# Patient Record
Sex: Female | Born: 1975 | Race: White | Hispanic: No | Marital: Single | Smoking: Never smoker
Health system: Southern US, Community
[De-identification: ages and names within clinical notes are randomized; demographics above are authoritative.]

## PROBLEM LIST (undated history)

## (undated) DIAGNOSIS — I10 Essential (primary) hypertension: Secondary | ICD-10-CM

## (undated) DIAGNOSIS — E119 Type 2 diabetes mellitus without complications: Secondary | ICD-10-CM

## (undated) DIAGNOSIS — G459 Transient cerebral ischemic attack, unspecified: Secondary | ICD-10-CM

## (undated) DIAGNOSIS — I639 Cerebral infarction, unspecified: Secondary | ICD-10-CM

## (undated) DIAGNOSIS — U071 COVID-19: Secondary | ICD-10-CM

## (undated) HISTORY — PX: ABDOMINAL HYSTERECTOMY: SHX81

---

## 2002-11-27 ENCOUNTER — Emergency Department (HOSPITAL_COMMUNITY): Admission: EM | Admit: 2002-11-27 | Discharge: 2002-11-27 | Payer: Self-pay | Admitting: Emergency Medicine

## 2003-03-20 ENCOUNTER — Encounter: Payer: Self-pay | Admitting: *Deleted

## 2003-03-20 ENCOUNTER — Ambulatory Visit (HOSPITAL_COMMUNITY): Admission: RE | Admit: 2003-03-20 | Discharge: 2003-03-20 | Payer: Self-pay | Admitting: *Deleted

## 2003-08-23 ENCOUNTER — Emergency Department (HOSPITAL_COMMUNITY): Admission: EM | Admit: 2003-08-23 | Discharge: 2003-08-23 | Payer: Self-pay | Admitting: Emergency Medicine

## 2018-06-20 ENCOUNTER — Other Ambulatory Visit: Payer: Self-pay

## 2018-06-20 ENCOUNTER — Emergency Department (HOSPITAL_COMMUNITY): Payer: Self-pay

## 2018-06-20 ENCOUNTER — Emergency Department (HOSPITAL_COMMUNITY)
Admission: EM | Admit: 2018-06-20 | Discharge: 2018-06-20 | Disposition: A | Discharge status: Home / Self Care | Payer: Self-pay | Attending: Emergency Medicine | Admitting: Emergency Medicine

## 2018-06-20 ENCOUNTER — Encounter (HOSPITAL_COMMUNITY): Payer: Self-pay

## 2018-06-20 DIAGNOSIS — E119 Type 2 diabetes mellitus without complications: Secondary | ICD-10-CM | POA: Insufficient documentation

## 2018-06-20 DIAGNOSIS — K047 Periapical abscess without sinus: Secondary | ICD-10-CM | POA: Insufficient documentation

## 2018-06-20 DIAGNOSIS — L03211 Cellulitis of face: Secondary | ICD-10-CM | POA: Insufficient documentation

## 2018-06-20 DIAGNOSIS — I10 Essential (primary) hypertension: Secondary | ICD-10-CM | POA: Insufficient documentation

## 2018-06-20 HISTORY — DX: Type 2 diabetes mellitus without complications: E11.9

## 2018-06-20 HISTORY — DX: Cerebral infarction, unspecified: I63.9

## 2018-06-20 HISTORY — DX: Transient cerebral ischemic attack, unspecified: G45.9

## 2018-06-20 HISTORY — DX: Essential (primary) hypertension: I10

## 2018-06-20 LAB — CBC WITH DIFFERENTIAL/PLATELET
BASOS ABS: 0.1 10*3/uL (ref 0.0–0.1)
BASOS PCT: 1 %
EOS ABS: 0.2 10*3/uL (ref 0.0–0.7)
EOS PCT: 2 %
HCT: 41.1 % (ref 36.0–46.0)
Hemoglobin: 14.5 g/dL (ref 12.0–15.0)
Lymphocytes Relative: 29 %
Lymphs Abs: 3 10*3/uL (ref 0.7–4.0)
MCH: 31.3 pg (ref 26.0–34.0)
MCHC: 35.3 g/dL (ref 30.0–36.0)
MCV: 88.8 fL (ref 78.0–100.0)
MONO ABS: 0.7 10*3/uL (ref 0.1–1.0)
MONOS PCT: 7 %
NEUTROS ABS: 6.4 10*3/uL (ref 1.7–7.7)
Neutrophils Relative %: 61 %
PLATELETS: 292 10*3/uL (ref 150–400)
RBC: 4.63 MIL/uL (ref 3.87–5.11)
RDW: 12.6 % (ref 11.5–15.5)
WBC: 10.3 10*3/uL (ref 4.0–10.5)

## 2018-06-20 LAB — BASIC METABOLIC PANEL
ANION GAP: 8 (ref 5–15)
BUN: 11 mg/dL (ref 6–20)
CALCIUM: 8.9 mg/dL (ref 8.9–10.3)
CO2: 26 mmol/L (ref 22–32)
CREATININE: 0.79 mg/dL (ref 0.44–1.00)
Chloride: 103 mmol/L (ref 98–111)
GFR calc Af Amer: >60 mL/min (ref >60)
GLUCOSE: 195 mg/dL — AB (ref 70–99)
Potassium: 3.3 mmol/L — ABNORMAL LOW (ref 3.5–5.1)
Sodium: 137 mmol/L (ref 135–145)

## 2018-06-20 MED ORDER — MORPHINE SULFATE (PF) 4 MG/ML IV SOLN
4.0000 mg | Freq: Once | INTRAVENOUS | Status: AC
  Administered 2018-06-20: 4 mg via INTRAVENOUS
  Filled 2018-06-20: qty 1

## 2018-06-20 MED ORDER — HYDROCODONE-ACETAMINOPHEN 5-325 MG PO TABS: 1.0000 | ORAL_TABLET | ORAL | 0 refills | Status: AC | PRN

## 2018-06-20 MED ORDER — IOHEXOL 300 MG/ML  SOLN
75.0000 mL | Freq: Once | INTRAMUSCULAR | Status: AC | PRN
  Administered 2018-06-20: 75 mL via INTRAVENOUS

## 2018-06-20 MED ORDER — CLINDAMYCIN HCL 150 MG PO CAPS: 300.0000 mg | ORAL_CAPSULE | Freq: Four times a day (QID) | ORAL | 0 refills | Status: AC

## 2018-06-20 MED ORDER — CLINDAMYCIN PHOSPHATE 600 MG/50ML IV SOLN
600.0000 mg | Freq: Once | INTRAVENOUS | Status: AC
  Administered 2018-06-20: 600 mg via INTRAVENOUS
  Filled 2018-06-20: qty 50

## 2018-06-20 MED ORDER — ONDANSETRON HCL 4 MG/2ML IJ SOLN
4.0000 mg | Freq: Once | INTRAMUSCULAR | Status: AC
Start: 2018-06-20 — End: 2018-06-20
  Administered 2018-06-20: 4 mg via INTRAVENOUS
  Filled 2018-06-20: qty 2

## 2018-06-20 NOTE — ED Provider Notes (Signed)
[facility] EMERGENCY DEPARTMENT Provider Note   CSN: [id number] Arrival date & time: 06/20/18  1645     History   Chief Complaint Chief Complaint  Patient presents with  . Abscess    HPI Madeline Butler is a 42 y.o. female presenting with dental pain and abscess.  She reports chronic poor dentition and is in the process of arranging for extractions of 6 teeth, her dentist is in [employer].  Over the past 24 hours she has developed increased pain, swelling which is tracking across her right cheek and when she woke this morning she had swelling and pain which ends just beneath her right eye.  She has had several rounds of amoxicillin, her last dose was taken last week which has temporized her dental pain until now.  She denies fevers or chills.  She does have mild nausea without emesis.  Past medical history is significant for newly diagnosed diabetes and hypertension.  She knows her blood pressures have been elevated which she states is driven by pain.  She has had no pain relievers prior to arrival.  She denies headache, chest pain, shortness of breath.  The history is provided by the patient.    Past Medical History:  Diagnosis Date  . Diabetes mellitus without complication (HCC)   . Hypertension   . Mini stroke (HCC)     There are no active problems to display for this patient.   Past Surgical History:  Procedure Laterality Date  . ABDOMINAL HYSTERECTOMY       OB History   None      Home Medications    Prior to Admission medications   Medication Sig Start Date End Date Taking? Authorizing Provider  clindamycin (CLEOCIN) 150 MG capsule Take 2 capsules (300 mg total) by mouth 4 (four) times daily. 06/20/18   Burgess Amor, PA-C  HYDROcodone-acetaminophen (NORCO/VICODIN) 5-325 MG tablet Take 1 tablet by mouth every 4 (four) hours as needed. 06/20/18   Burgess Amor, PA-C    Family History No family history on file.  Social History Social History   Tobacco Use  .  Smoking status: Never Smoker  . Smokeless tobacco: Never Used  Substance Use Topics  . Alcohol use: Not on file    Comment: occasional   . Drug use: Never     Allergies   Patient has no known allergies.   Review of Systems Review of Systems  Constitutional: Negative for chills and fever.  HENT: Positive for dental problem and facial swelling. Negative for sore throat.   Eyes: Negative for discharge, redness and visual disturbance.  Respiratory: Negative for shortness of breath.   Cardiovascular: Negative.   Gastrointestinal: Negative.  Negative for nausea and vomiting.  Musculoskeletal: Negative for neck pain and neck stiffness.  Skin: Positive for color change.  Neurological: Positive for headaches.     Physical Exam Updated Vital Signs BP (!) 166/95 (BP Location: Left Arm)   Pulse 70   Temp 98 F (36.7 C) (Oral)   Resp 14   Ht 5\' 8"  (1.727 m)   Wt (!) 154.2 kg (340 lb)   SpO2 97%   BMI 51.70 kg/m   Physical Exam  Constitutional: She is oriented to person, place, and time. She appears well-developed and well-nourished. No distress.  HENT:  Head: Normocephalic and atraumatic.  Right Ear: Tympanic membrane and external ear normal. No middle ear effusion.  Left Ear: Tympanic membrane and external ear normal.  No middle ear effusion.  Nose: Nose  normal. No mucosal edema or rhinorrhea.  Mouth/Throat: Oropharynx is clear and moist and mucous membranes are normal. No oral lesions. No trismus in the jaw. Dental abscesses present.  Gingival edema around the 1st and second molars right upper, no fluctuance or drainage.  No trismus.  No sublingual edema.  ttp with mild erythema and induration of right cheek and along right nasal fold to the right infraorbital space. No fluctuance.   Eyes: Conjunctivae are normal.  Neck: Normal range of motion. Neck supple.  Cardiovascular: Normal rate and normal heart sounds.  Pulmonary/Chest: Effort normal.  Abdominal: She exhibits no  distension.  Musculoskeletal: Normal range of motion.  Lymphadenopathy:    She has no cervical adenopathy.  Neurological: She is alert and oriented to person, place, and time.  Skin: Skin is warm and dry. No erythema.  Psychiatric: She has a normal mood and affect.     ED Treatments / Results  Labs (all labs ordered are listed, but only abnormal results are displayed) Labs Reviewed  BASIC METABOLIC PANEL - Abnormal; Notable for the following components:      Result Value   Potassium 3.3 (*)    Glucose, Bld 195 (*)    All other components within normal limits  CBC WITH DIFFERENTIAL/PLATELET    EKG None  Radiology Ct Maxillofacial W Contrast  Result Date: 06/20/2018 CLINICAL DATA:  Right facial cellulitis EXAM: CT MAXILLOFACIAL WITH CONTRAST TECHNIQUE: Multidetector CT imaging of the maxillofacial structures was performed with intravenous contrast. Multiplanar CT image reconstructions were also generated. CONTRAST:  75mL OMNIPAQUE IOHEXOL 300 MG/ML  SOLN COMPARISON:  None. FINDINGS: Osseous: No facial fracture. Dental: Periapical lucencies of teeth 9 and 10. Multiple right mandibular dental caries, at teeth 29-31. Orbits: The globes are intact. Normal appearance of the intra- and extraconal fat. Symmetric extraocular muscles. Sinuses: Moderate right maxillary sinus mucosal thickening. Soft tissues: Mild subcutaneous inflammation of the lower right face. No abscess or fluid collection. Limited intracranial: Normal. IMPRESSION: 1. Right facial cellulitis without clear source. 2. Poor dentition with multiple caries of the right mandibular molars and left maxillary incisors. However, there is relative sparing of soft tissues between the teeth and the inflamed skin surface. Electronically Signed   By: Deatra Robinson M.D.   On: 06/20/2018 20:43    Procedures Procedures (including critical care time)  Medications Ordered in ED Medications  clindamycin (CLEOCIN) IVPB 600 mg (0 mg Intravenous  Stopped 06/20/18 1952)  morphine 4 MG/ML injection 4 mg (4 mg Intravenous Given 06/20/18 1913)  ondansetron (ZOFRAN) injection 4 mg (4 mg Intravenous Given 06/20/18 1913)  iohexol (OMNIPAQUE) 300 MG/ML solution 75 mL (75 mLs Intravenous Contrast Given 06/20/18 2005)  morphine 4 MG/ML injection 4 mg (4 mg Intravenous Given 06/20/18 2025)     Initial Impression / Assessment and Plan / ED Course  I have reviewed the triage vital signs and the nursing notes.  Pertinent labs & imaging results that were available during my care of the patient were reviewed by me and considered in my medical decision making (see chart for details).     Pt given IV clindamycin, morphine and zofran.  Pain 7/10 prior to transport to CT.  Repeated morphine after returned from CT with significantly improved pain.  Pt endorses less tense feeling right cheek with noted receding of edema from the infraorbital space after receiving the clindamycin and before dc.  She was advised continued abx, hydrocodone also prescribed.  Advised f/u with dentist as planned, return precautions discussed.  No facial or dental abscess, no ludwigs angina. Early improvement after first dose of abx, anticipate prn f/u here.   Final Clinical Impressions(s) / ED Diagnoses   Final diagnoses:  Dental abscess  Facial cellulitis    ED Discharge Orders        Ordered    clindamycin (CLEOCIN) 150 MG capsule  4 times daily     06/20/18 2124    HYDROcodone-acetaminophen (NORCO/VICODIN) 5-325 MG tablet  Every 4 hours PRN     06/20/18 2124       Burgess Amordol, Dwayna Kentner, PA-C 06/21/18 0134    Benjiman CorePickering, Nathan, MD 06/27/18 [phone]

## 2018-06-20 NOTE — Discharge Instructions (Addendum)
Complete your entire course of antibiotics as prescribed.  You  may use the hydrocodone for pain relief but do not drive within 4 hours of taking as this will make you drowsy.  Avoid applying heat or ice to this abscess area which can worsen your symptoms.  You may use warm salt water swish and spit treatment or half peroxide and water swish and spit after meals to keep this area clean as discussed.  Call your dentist for further management of your symptoms as discussed.

## 2018-06-20 NOTE — ED Triage Notes (Signed)
Pt has a dental abscess to the right upper tooth. Facial swelling noted. Pt has not had any chills. In the process of having 6 teeth removed. Dentist recommended pt to come to ED if pain got any worse.

## 2019-01-05 ENCOUNTER — Emergency Department: Payer: Medicaid - Out of State

## 2019-01-05 ENCOUNTER — Emergency Department
Admission: EM | Admit: 2019-01-05 | Discharge: 2019-01-05 | Disposition: A | Discharge status: Home / Self Care | Payer: Medicaid - Out of State | Attending: Emergency Medicine | Admitting: Emergency Medicine

## 2019-01-05 ENCOUNTER — Other Ambulatory Visit: Payer: Self-pay

## 2019-01-05 DIAGNOSIS — E119 Type 2 diabetes mellitus without complications: Secondary | ICD-10-CM | POA: Insufficient documentation

## 2019-01-05 DIAGNOSIS — I10 Essential (primary) hypertension: Secondary | ICD-10-CM | POA: Insufficient documentation

## 2019-01-05 DIAGNOSIS — M25559 Pain in unspecified hip: Secondary | ICD-10-CM

## 2019-01-05 DIAGNOSIS — M25551 Pain in right hip: Secondary | ICD-10-CM | POA: Diagnosis present

## 2019-01-05 DIAGNOSIS — M1611 Unilateral primary osteoarthritis, right hip: Secondary | ICD-10-CM | POA: Insufficient documentation

## 2019-01-05 MED ORDER — MELOXICAM 15 MG PO TABS: 15.0000 mg | ORAL_TABLET | Freq: Every day | ORAL | 0 refills | Status: AC

## 2019-01-05 MED ORDER — KETOROLAC TROMETHAMINE 30 MG/ML IJ SOLN
30.0000 mg | Freq: Once | INTRAMUSCULAR | Status: AC
Start: 2019-01-05 — End: 2019-01-05
  Administered 2019-01-05: 30 mg via INTRAMUSCULAR
  Filled 2019-01-05: qty 1

## 2019-01-05 MED ORDER — MELOXICAM 15 MG PO TABS: 15.0000 mg | ORAL_TABLET | Freq: Every day | ORAL | 0 refills | Status: DC

## 2019-01-05 NOTE — ED Notes (Signed)
Patient to ED with right hip pain. Has had pain x 4 days. Pain has become increasing worse. Denies falls or trauma. Describes pain as "toothache pain."

## 2019-01-05 NOTE — ED Provider Notes (Signed)
.     Evon Slack, PA-C 01/24/19 1138    Phineas Semen, MD 01/26/19 [phone]

## 2019-01-05 NOTE — Discharge Instructions (Addendum)
Please take Tylenol 1000 mg every 6 hours as needed for pain.  You may take meloxicam daily with food.  Follow-up with orthopedics in 1 week if no improvement.  Return to the ER for any worsening symptoms or urgent changes in health.

## 2019-01-05 NOTE — ED Triage Notes (Signed)
Patient reports having right leg pain from hip down.  Patient reports had weakness from TIA approximately a year ago and did well with rehab.  Reports recently it just aches all the time.

## 2019-01-05 NOTE — ED Provider Notes (Signed)
[facility] EMERGENCY DEPARTMENT Provider Note   CSN: [id number] Arrival date & time: 01/05/19  1913     History   Chief Complaint Chief Complaint  Patient presents with  . Leg Pain    HPI Madeline Butler is a 43 y.o. female.  Presents emergency department evaluation right hip pain.  She is had pain for 4 weeks.  No trauma or injury.  She points to the right buttocks, lateral hip and anterior thigh.  She describes aching pain increased with standing and walking.  She is had no relief with Tylenol.  No burning numbness or tingling.  No radicular symptoms down the leg.  Patient states the pain is deep.  No trauma or injury.  Has not had any x-rays of the right hip.  HPI  Past Medical History:  Diagnosis Date  . Diabetes mellitus without complication (HCC)   . Hypertension   . Mini stroke (HCC)     There are no active problems to display for this patient.   Past Surgical History:  Procedure Laterality Date  . ABDOMINAL HYSTERECTOMY       OB History   No obstetric history on file.      Home Medications    Prior to Admission medications   Medication Sig Start Date End Date Taking? Authorizing Provider  clindamycin (CLEOCIN) 150 MG capsule Take 2 capsules (300 mg total) by mouth 4 (four) times daily. 06/20/18   Burgess Amor, PA-C  HYDROcodone-acetaminophen (NORCO/VICODIN) 5-325 MG tablet Take 1 tablet by mouth every 4 (four) hours as needed. 06/20/18   Burgess Amor, PA-C  meloxicam (MOBIC) 15 MG tablet Take 1 tablet (15 mg total) by mouth daily. 01/05/19 01/05/20  Evon Slack, PA-C    Family History No family history on file.  Social History Social History   Tobacco Use  . Smoking status: Never Smoker  . Smokeless tobacco: Never Used  Substance Use Topics  . Alcohol use: Not on file    Comment: occasional   . Drug use: Never     Allergies   Patient has no known allergies.   Review of Systems Review of Systems  Constitutional:  Negative for fever.  Gastrointestinal: Negative for nausea and vomiting.  Musculoskeletal: Positive for arthralgias and gait problem. Negative for back pain, joint swelling, myalgias, neck pain and neck stiffness.  Skin: Negative for rash and wound.  Neurological: Negative for numbness.     Physical Exam Updated Vital Signs BP (!) 190/96 (BP Location: Left Arm)   Pulse 86   Temp 98 F (36.7 C) (Oral)   Resp 18   Ht 5\' 9"  (1.753 m)   Wt 127 kg   SpO2 94%   BMI 41.35 kg/m   Physical Exam Constitutional:      Appearance: She is well-developed.  HENT:     Head: Normocephalic and atraumatic.  Eyes:     Conjunctiva/sclera: Conjunctivae normal.  Neck:     Musculoskeletal: Normal range of motion.  Cardiovascular:     Rate and Rhythm: Normal rate.  Pulmonary:     Effort: Pulmonary effort is normal. No respiratory distress.  Musculoskeletal:     Comments: Examination of the right hip shows full range of motion but moderate pain with right hip internal rotation.  She is tender over the trochanteric bursa.  She has a negative straight leg raise.  She is nervous intact in right lower extremity.  No swelling or edema throughout the right leg.  Skin:  General: Skin is warm.     Findings: No rash.  Neurological:     Mental Status: She is alert and oriented to person, place, and time.  Psychiatric:        Behavior: Behavior normal.        Thought Content: Thought content normal.      ED Treatments / Results  Labs (all labs ordered are listed, but only abnormal results are displayed) Labs Reviewed - No data to display  EKG None  Radiology Dg Hip Unilat With Pelvis 2-3 Views Right  Result Date: 01/05/2019 CLINICAL DATA:  Right hip pain 4 days.  No injury. EXAM: DG HIP (WITH OR WITHOUT PELVIS) 2-3V RIGHT COMPARISON:  None. FINDINGS: Mild symmetric degenerative change of the hips. Mild degenerate change of the spine. IMPRESSION: No acute findings. Mild symmetric degenerative  change of the hips. Electronically Signed   By: Elberta Fortis M.D.   On: 01/05/2019 20:56    Procedures Procedures (including critical care time)  Medications Ordered in ED Medications  ketorolac (TORADOL) 30 MG/ML injection 30 mg (30 mg Intramuscular Given 01/05/19 2045)     Initial Impression / Assessment and Plan / ED Course  I have reviewed the triage vital signs and the nursing notes.  Pertinent labs & imaging results that were available during my care of the patient were reviewed by me and considered in my medical decision making (see chart for details).     43 year old female with right hip pain consistent with right hip osteoarthritis.  No evidence of acute bony abnormality.  No neurological deficits in the right lower extremity.  Vital signs are stable.  She is discharged home with meloxicam 15 mg daily for 2 weeks.  She will take with Tylenol.  She will follow-up with orthopedics if no improvement 1 week.  Final Clinical Impressions(s) / ED Diagnoses   Final diagnoses:  Right hip pain  Primary osteoarthritis of right hip    ED Discharge Orders         Ordered    meloxicam (MOBIC) 15 MG tablet  Daily     01/05/19 2147           Ronnette Juniper 01/05/19 2148    Phineas Semen, MD 01/05/19 2207

## 2019-05-10 IMAGING — CT CT MAXILLOFACIAL W/ CM
3 of 4 series · 16 of 47 positions shown, 19 images · IV contrast (Isovue)
Comparison: None.

CLINICAL DATA: Right facial cellulitis

EXAM:
CT MAXILLOFACIAL WITH CONTRAST
TECHNIQUE: Multidetector CT imaging of the maxillofacial structures was
performed with intravenous contrast. Multiplanar CT image
reconstructions were also generated.
CONTRAST:  75mL OMNIPAQUE IOHEXOL 300 MG/ML  SOLN

[Series 2: max soft · axial · 0.36mm/px · z∈[-25,+109]mm · 11 of 79 slices shown, 14 images]
[im 6/79  brain]
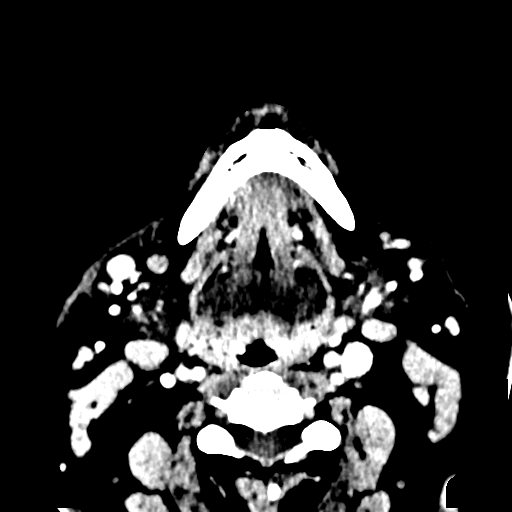
[im 6/79  bone]
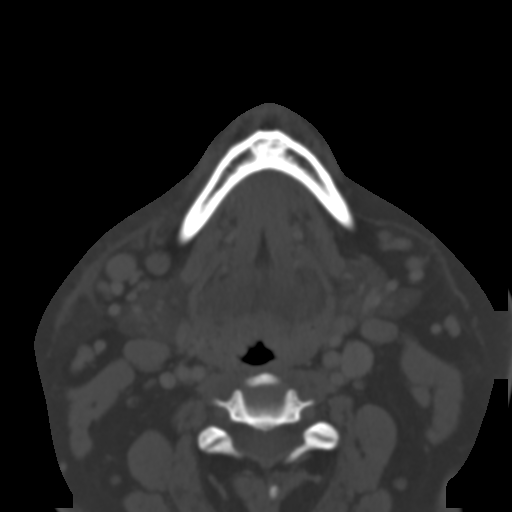
[im 11/79  bone]
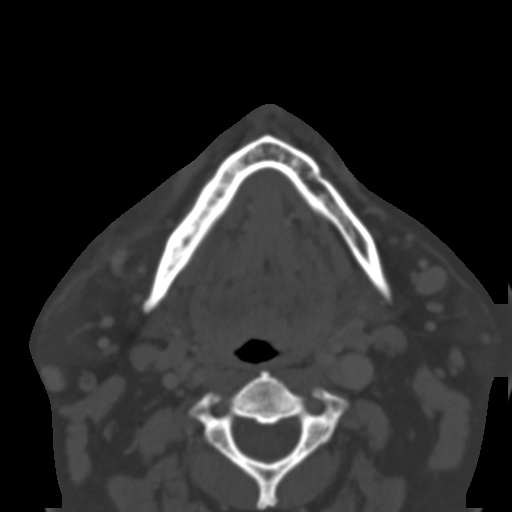
[im 19/79  bone]
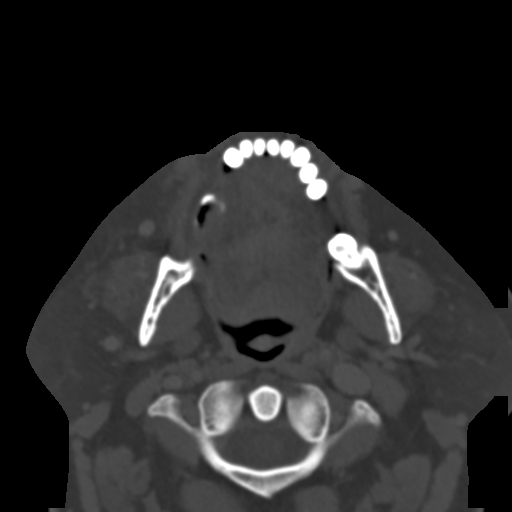
[im 25/79  bone]
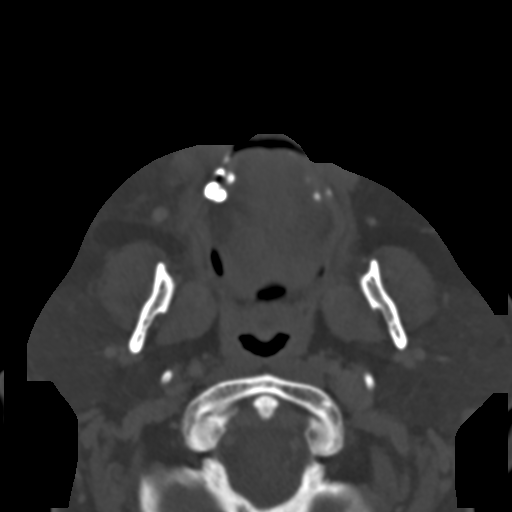
[im 33/79  brain]
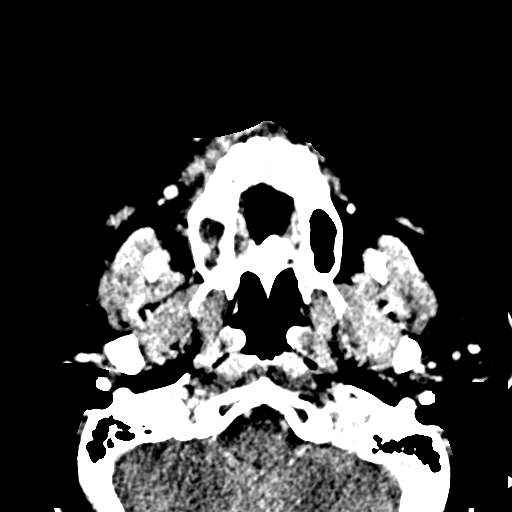
[im 33/79  bone]
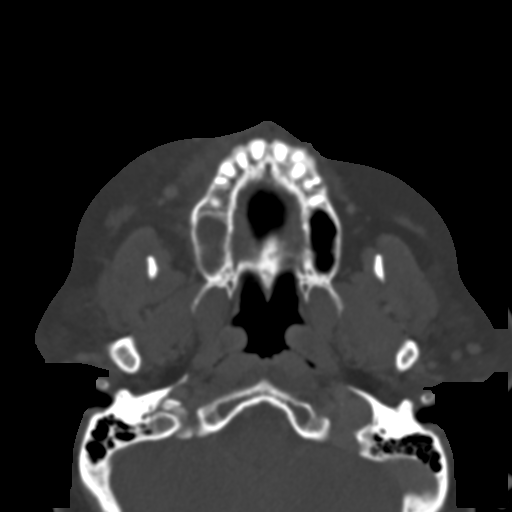
[im 41/79  bone]
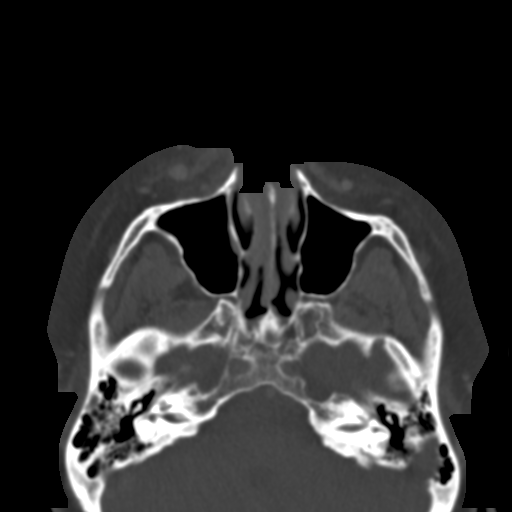
[im 46/79  bone]
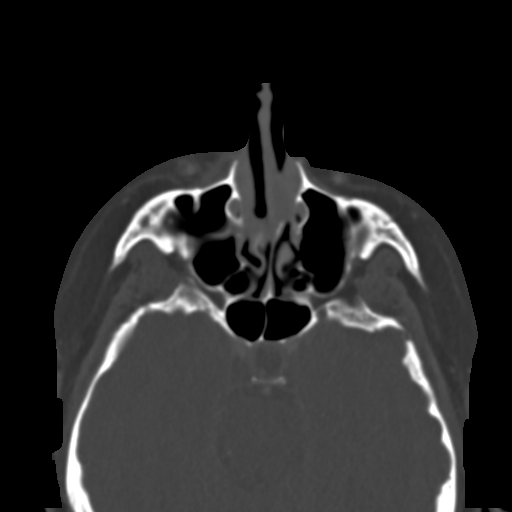
[im 54/79  bone]
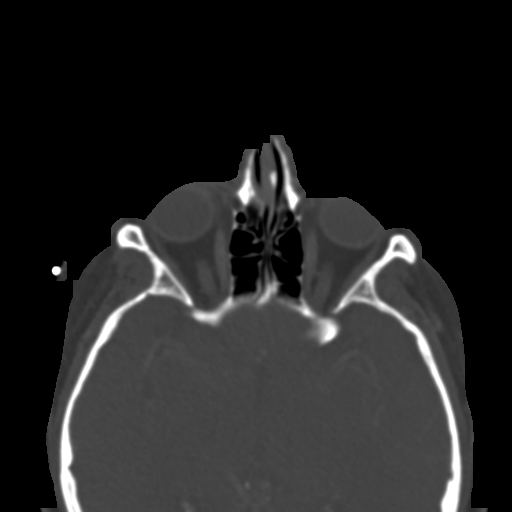
[im 60/79  brain]
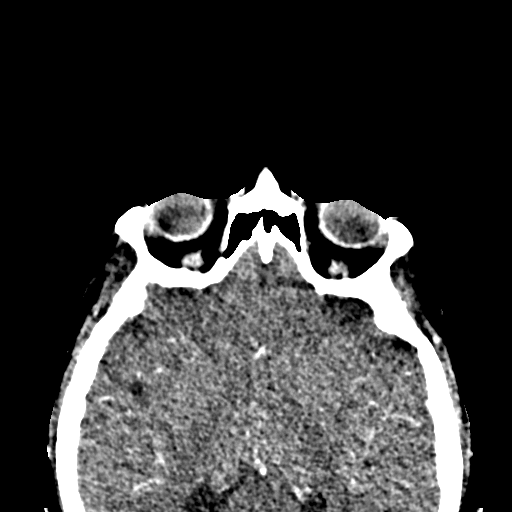
[im 60/79  bone]
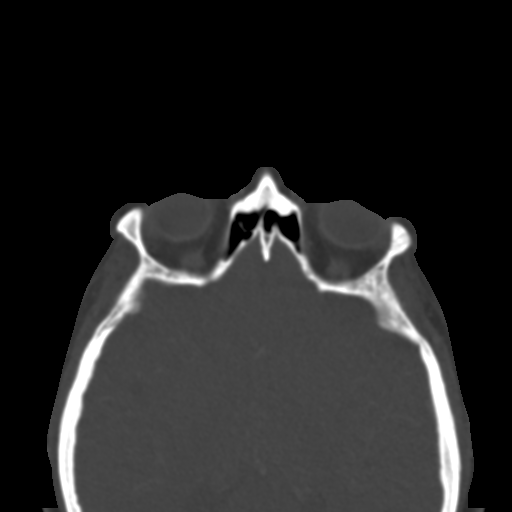
[im 68/79  bone]
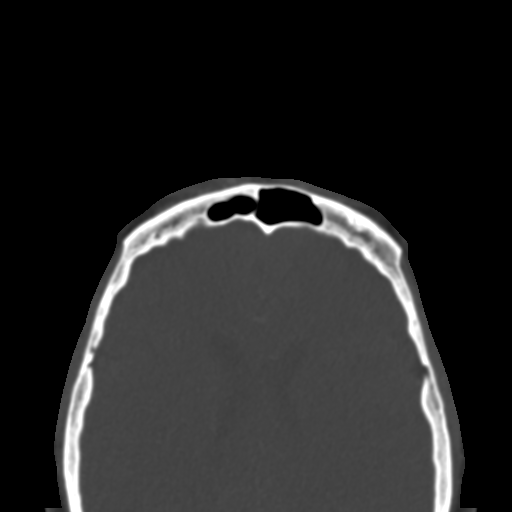
[im 73/79  bone]
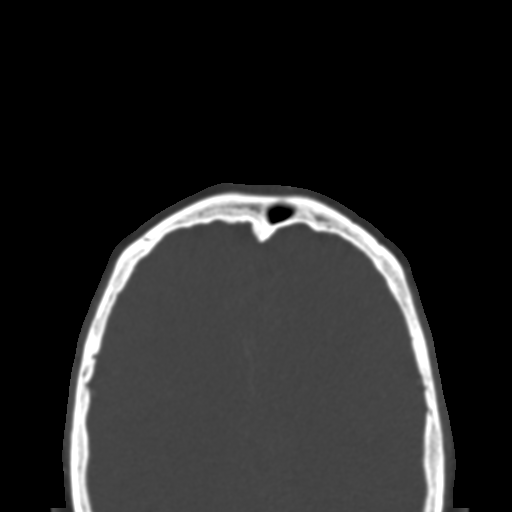

[Series 4: coronal soft · coronal · 0.36mm/px · 3 of 76 slices shown]
[im 26/76  bone]
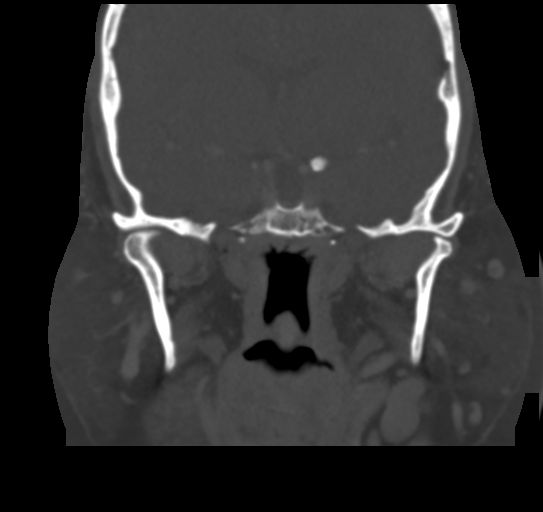
[im 34/76  bone]
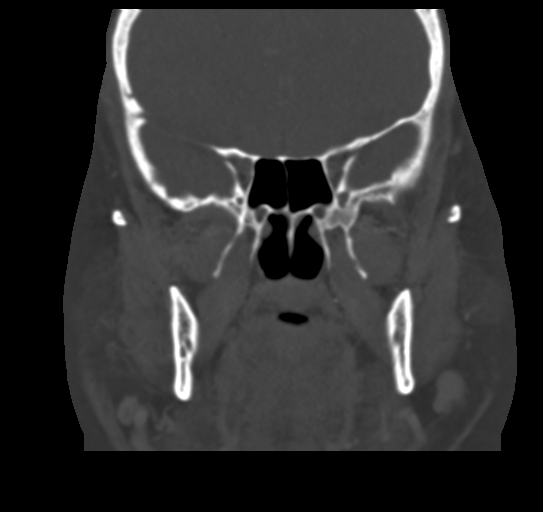
[im 42/76  bone]
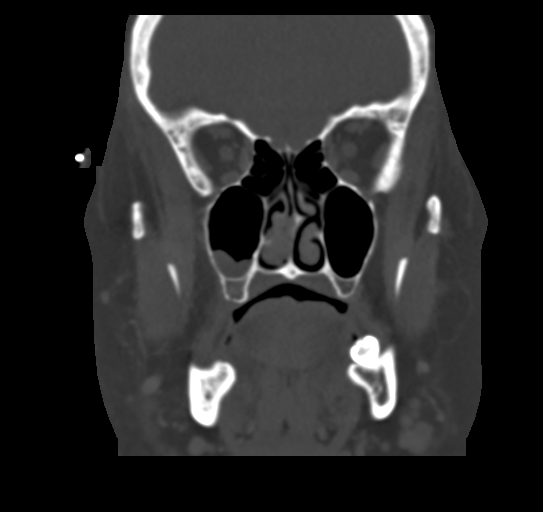

[Series 7: sagittal bone · sagittal · 0.29mm/px · 2 of 92 slices shown]
[im 31/92  bone]
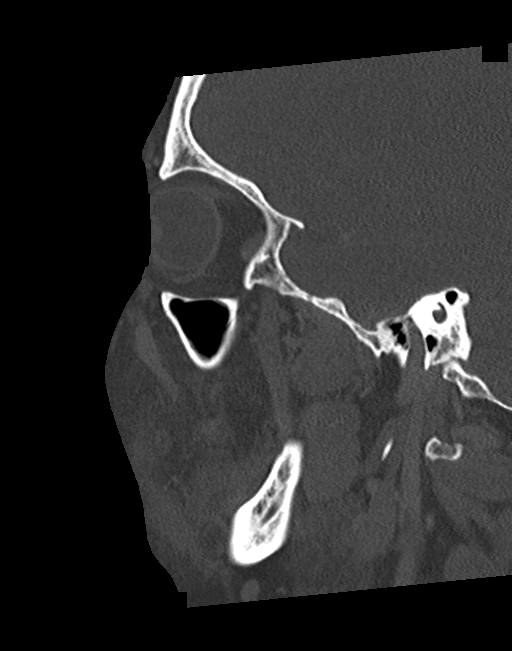
[im 61/92  bone]
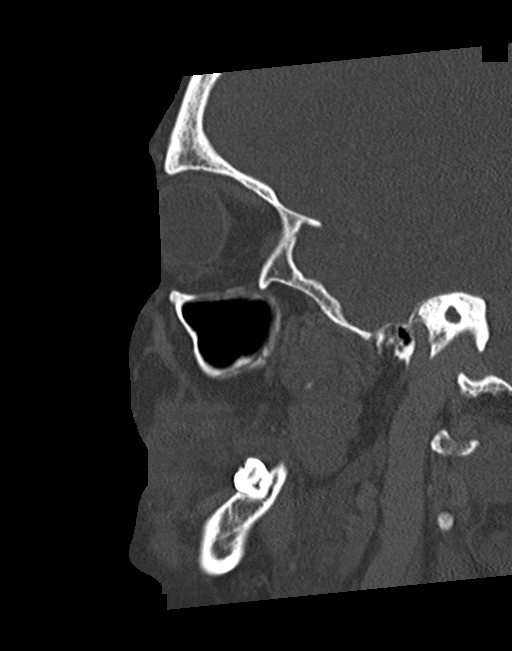

[16 of 47 positions shown; findings below may reference images not displayed]

FINDINGS: Osseous: No facial fracture.

Dental: Periapical lucencies of teeth 9 and 10.. Multiple right
mandibular dental caries, at teeth 29-31.

Orbits: The globes are intact. Normal appearance of the intra- and
extraconal fat. Symmetric extraocular muscles.

Sinuses: Moderate right maxillary sinus mucosal thickening.

Soft tissues: Mild subcutaneous inflammation of the lower right
face. No abscess or fluid collection.

Limited intracranial: Normal.
IMPRESSION: 1. Right facial cellulitis without clear source.
2. Poor dentition with multiple caries of the right mandibular
molars and left maxillary incisors. However, there is relative
sparing of soft tissues between the teeth and the inflamed skin
surface.

## 2019-11-25 IMAGING — CR DG HIP (WITH OR WITHOUT PELVIS) 2-3V*R*
3 series · 3 of 3 positions shown · non-contrast
Comparison: None.

CLINICAL DATA: Right hip pain 4 days.  No injury.

EXAM:
DG HIP (WITH OR WITHOUT PELVIS) 2-3V RIGHT

[hip ap]
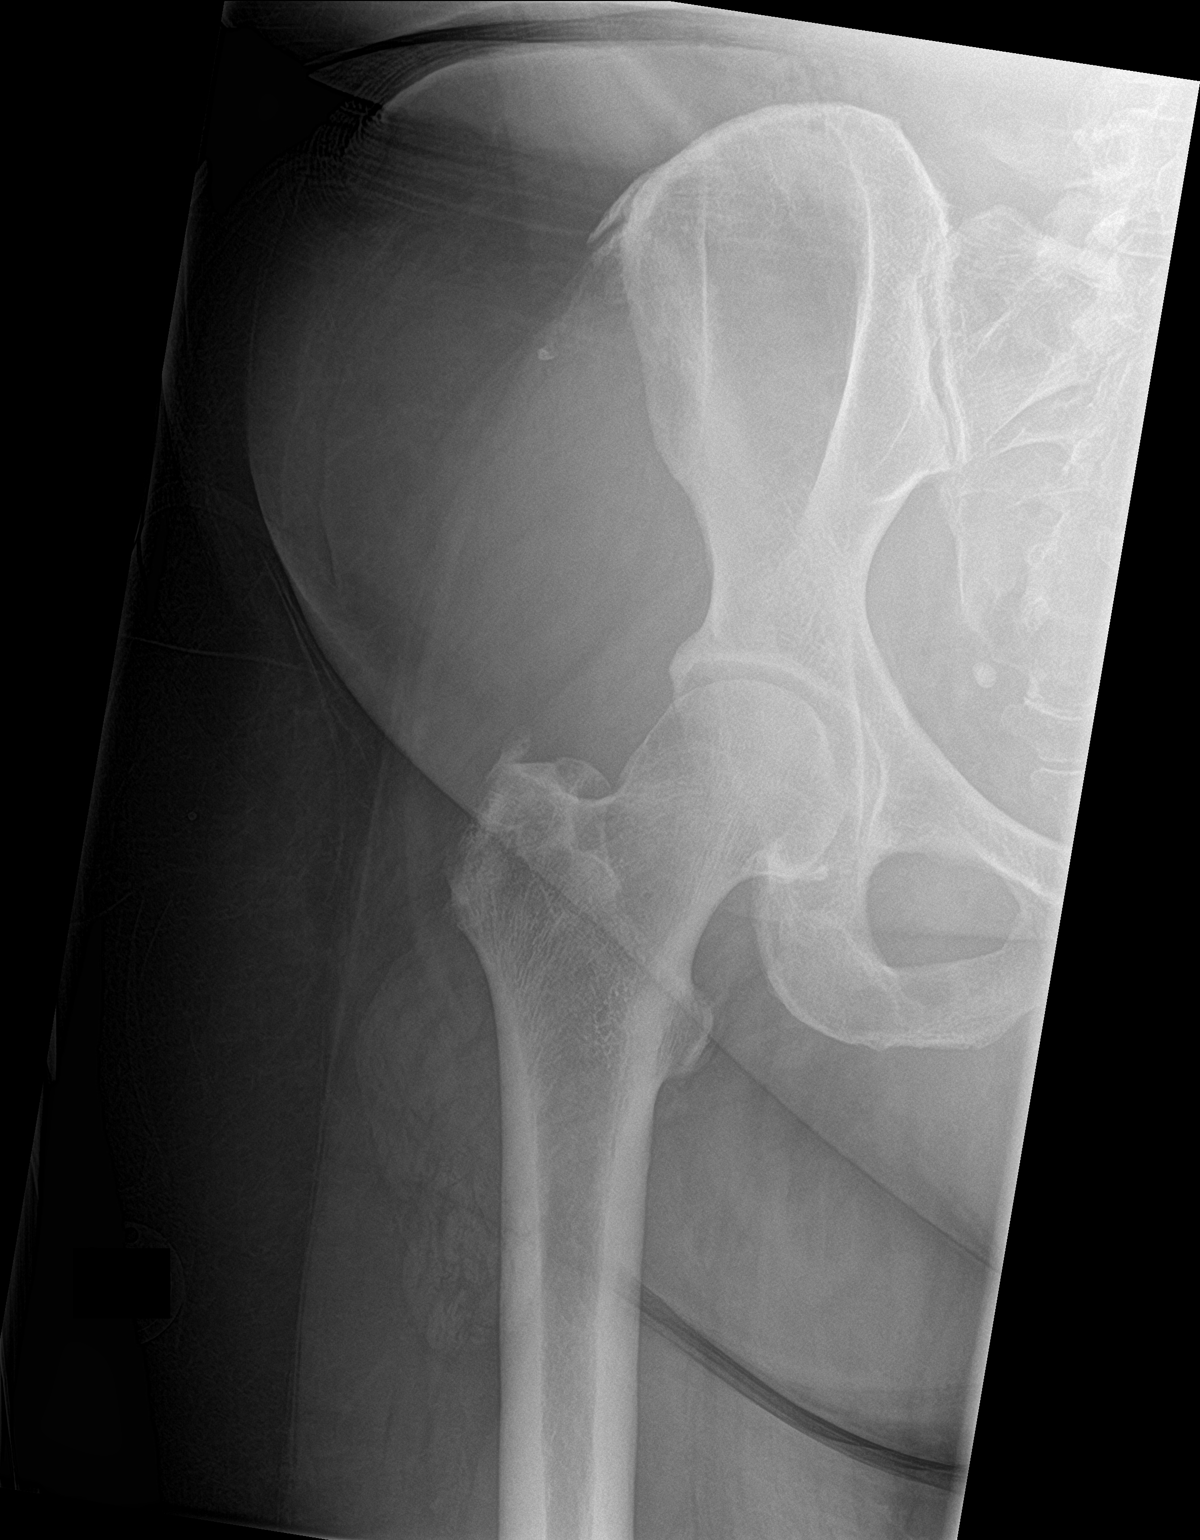

[hip lat]
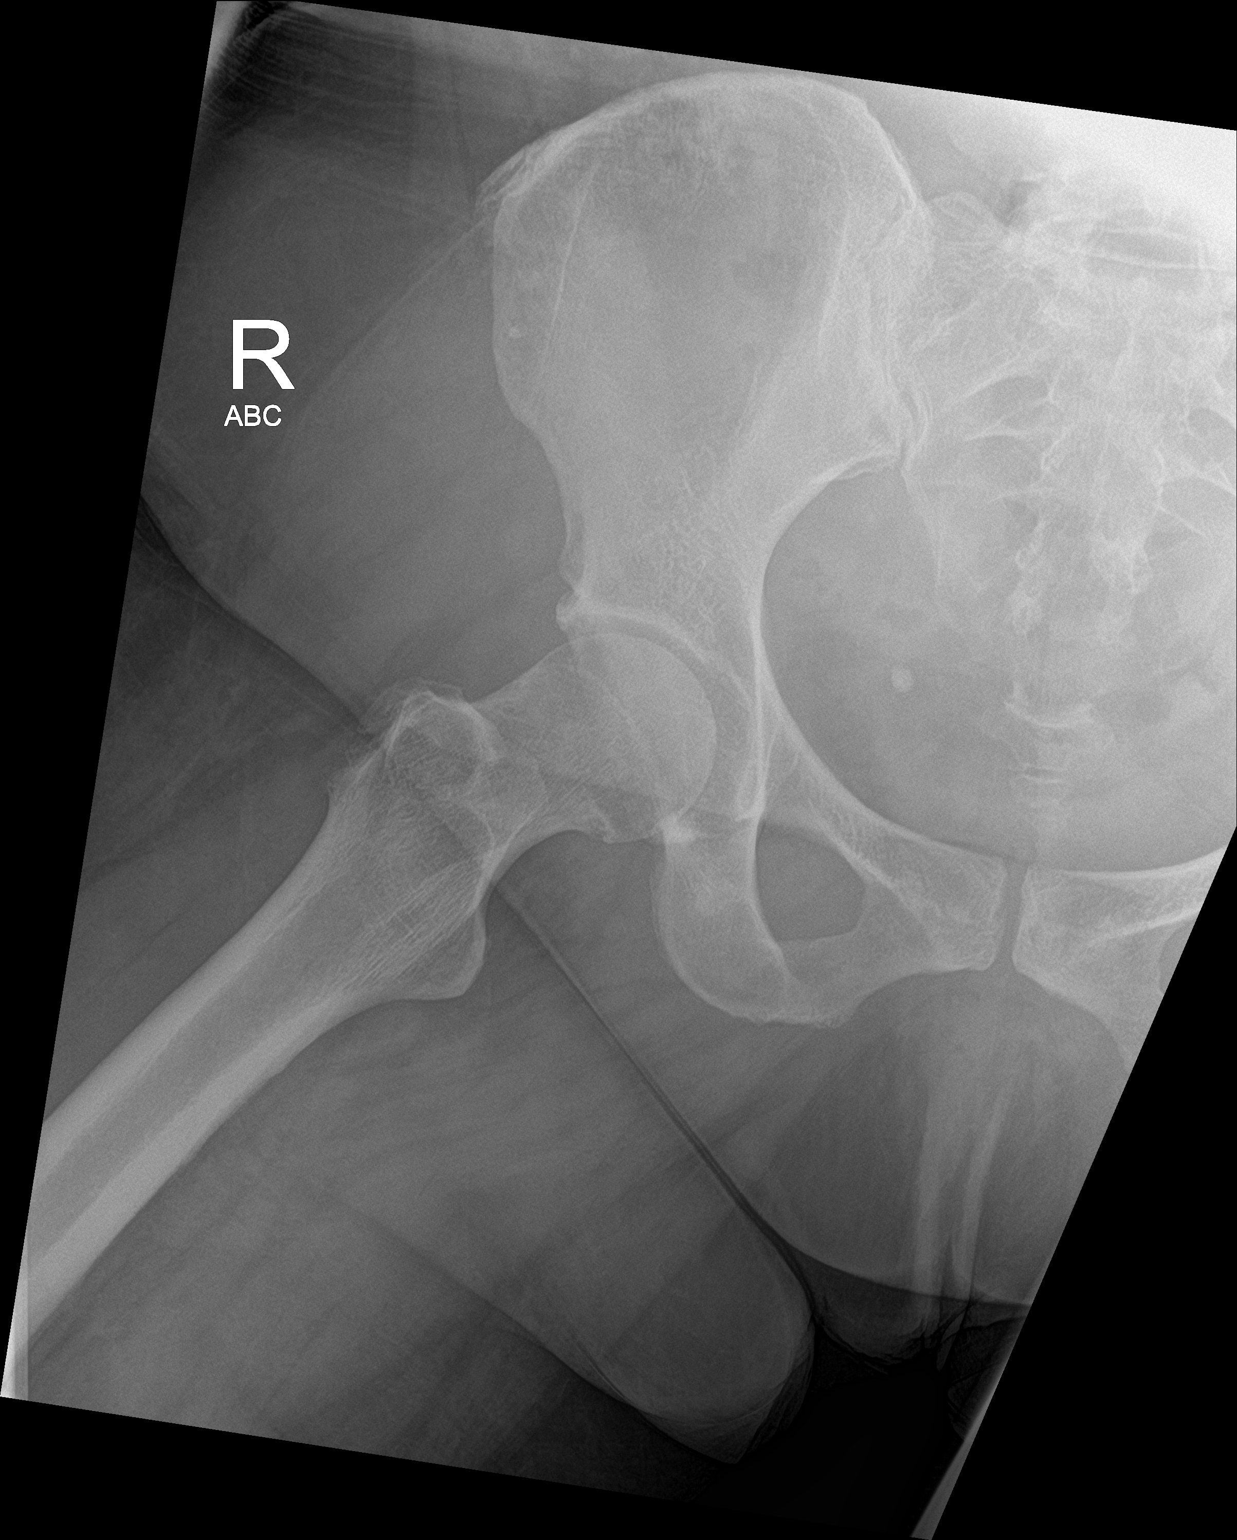

[pelvis ap]
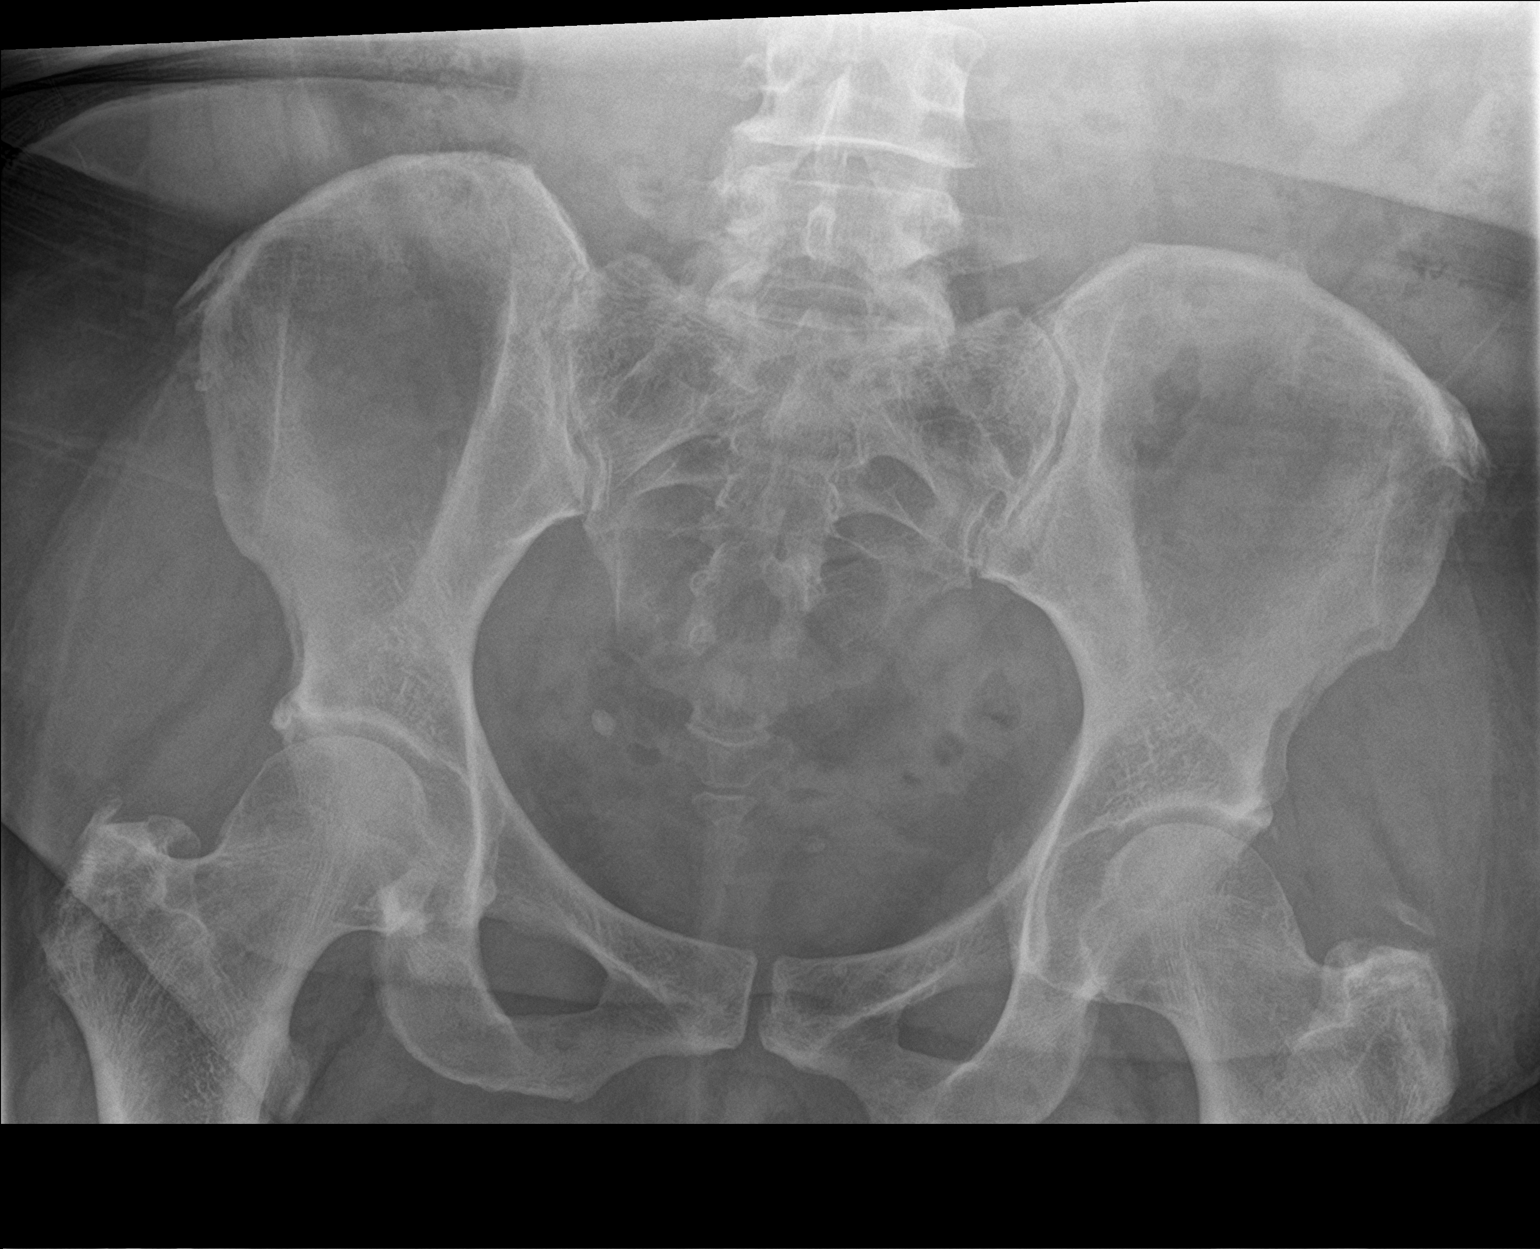

[3 of 3 positions shown; findings below may reference images not displayed]

FINDINGS: Mild symmetric degenerative change of the hips. Mild degenerate
change of the spine.
IMPRESSION: No acute findings.

Mild symmetric degenerative change of the hips.

## 2021-05-08 ENCOUNTER — Other Ambulatory Visit: Payer: Self-pay

## 2021-05-08 ENCOUNTER — Emergency Department (HOSPITAL_COMMUNITY)
Admission: EM | Admit: 2021-05-08 | Discharge: 2021-05-08 | Disposition: A | Discharge status: Home / Self Care | Payer: Medicaid - Out of State | Attending: Emergency Medicine | Admitting: Emergency Medicine

## 2021-05-08 ENCOUNTER — Emergency Department (HOSPITAL_COMMUNITY): Payer: Medicaid - Out of State

## 2021-05-08 ENCOUNTER — Encounter (HOSPITAL_COMMUNITY): Payer: Self-pay | Admitting: Emergency Medicine

## 2021-05-08 DIAGNOSIS — I10 Essential (primary) hypertension: Secondary | ICD-10-CM | POA: Diagnosis not present

## 2021-05-08 DIAGNOSIS — E119 Type 2 diabetes mellitus without complications: Secondary | ICD-10-CM | POA: Diagnosis not present

## 2021-05-08 DIAGNOSIS — R059 Cough, unspecified: Secondary | ICD-10-CM | POA: Diagnosis present

## 2021-05-08 DIAGNOSIS — U071 COVID-19: Secondary | ICD-10-CM | POA: Diagnosis not present

## 2021-05-08 DIAGNOSIS — Z8616 Personal history of COVID-19: Secondary | ICD-10-CM | POA: Diagnosis not present

## 2021-05-08 HISTORY — DX: COVID-19: U07.1

## 2021-05-08 LAB — RESP PANEL BY RT-PCR (FLU A&B, COVID) ARPGX2
Influenza A by PCR: NEGATIVE
Influenza B by PCR: NEGATIVE
SARS Coronavirus 2 by RT PCR: POSITIVE — AB

## 2021-05-08 LAB — GROUP A STREP BY PCR: Group A Strep by PCR: NOT DETECTED

## 2021-05-08 MED ORDER — GUAIFENESIN-DM 100-10 MG/5ML PO SYRP
5.0000 mL | ORAL_SOLUTION | Freq: Once | ORAL | Status: AC
  Administered 2021-05-08: 5 mL via ORAL
  Filled 2021-05-08: qty 5

## 2021-05-08 MED ORDER — PREDNISONE 5 MG/5ML PO SOLN
30.0000 mg | Freq: Once | ORAL | Status: DC
  Filled 2021-05-08: qty 30

## 2021-05-08 MED ORDER — PROCHLORPERAZINE MALEATE 5 MG PO TABS
10.0000 mg | ORAL_TABLET | Freq: Once | ORAL | Status: AC
  Administered 2021-05-08: 10 mg via ORAL
  Filled 2021-05-08: qty 2

## 2021-05-08 MED ORDER — PREDNISONE 20 MG PO TABS: 40.0000 mg | ORAL_TABLET | Freq: Once | ORAL | Status: DC

## 2021-05-08 NOTE — ED Triage Notes (Addendum)
Patient c/o cough, sore throat, and headache that started x3 days ago and is progressively getting worse. Per patient cough productive with thick clear sputum. Patient reports photosensitivity with headache. Patient taking Nightquil with no relief. Denies any fever. Per patient home test for Covid  was negative.

## 2021-05-08 NOTE — Discharge Instructions (Addendum)
You have been seen here for URI like symptoms.  I recommend taking Tylenol for fever control and ibuprofen for pain control please follow dosing on the back of bottle.  I recommend staying hydrated and if you do not an appetite, I recommend soups as this will provide you with fluids and calories.    you are Covid positive you must self quarantine for 5 days starting on symptom onset, if at the end of those 5 days you are feeling better you may return back to school/work, if you continue to have symptoms you must self quarantine for additional 5 days.  I would like you to contact "post Covid care" as they will provide you with information how to manage your Covid symptoms Come back to the emergency department if you develop chest pain, shortness of breath, severe abdominal pain, uncontrolled nausea, vomiting, diarrhea.  

## 2021-05-08 NOTE — ED Provider Notes (Signed)
[facility] EMERGENCY DEPARTMENT Provider Note   CSN: [id number] Arrival date & time: 05/08/21  1303     History Chief Complaint  Patient presents with   Cough    Madeline Butler is a 45 y.o. female.  HPI  Patient with significant medical history of COVID, diabetes, hypertension, strokes currently on anticoagulant resents to the emergency department with chief complaint of URI-like symptoms.  Patient states this started approximately 1 week ago, endorses headaches, nasal congestion, productive cough, sore throat, but denies fevers, chills, abdominal pain, nausea, vomiting, diarrhea or general body aches.  She denies recent sick contacts, is up-to-date on her COVID and her influenza shot.  Patient states that she has been tolerating p.o. without difficulty, she is not immunocompromise.  Patient states headache is a pressure-like sensation in the front of her head and back of her head, she denies change in vision, paresthesias or weakness in the upper or lower extremities, she denies nausea, vomiting, lightheaded or dizziness.  She denies recent falls.  Patient denies alleviating factors.  Patient denies chest pain, shortness of breath, abdominal pain.  Past Medical History:  Diagnosis Date   COVID    Diabetes mellitus without complication (HCC)    Hypertension    Mini stroke (HCC)     There are no problems to display for this patient.   Past Surgical History:  Procedure Laterality Date   ABDOMINAL HYSTERECTOMY       OB History     Gravida  1   Para  1   Term  1   Preterm      AB      Living  1      SAB      IAB      Ectopic      Multiple      Live Births              No family history on file.  Social History   Tobacco Use   Smoking status: Never   Smokeless tobacco: Never  Vaping Use   Vaping Use: Never used  Substance Use Topics   Alcohol use: Not Currently   Drug use: Never    Home Medications Prior to Admission medications    Medication Sig Start Date End Date Taking? Authorizing Provider  clindamycin (CLEOCIN) 150 MG capsule Take 2 capsules (300 mg total) by mouth 4 (four) times daily. 06/20/18   Burgess Amor, PA-C  HYDROcodone-acetaminophen (NORCO/VICODIN) 5-325 MG tablet Take 1 tablet by mouth every 4 (four) hours as needed. 06/20/18   Burgess Amor, PA-C    Allergies    Patient has no known allergies.  Review of Systems   Review of Systems  Constitutional:  Negative for chills and fever.  HENT:  Positive for congestion and sore throat. Negative for trouble swallowing.   Respiratory:  Positive for cough. Negative for shortness of breath.   Cardiovascular:  Negative for chest pain.  Gastrointestinal:  Negative for abdominal pain, diarrhea, nausea and vomiting.  Genitourinary:  Negative for enuresis.  Musculoskeletal:  Negative for myalgias.  Skin:  Negative for rash.  Neurological:  Positive for headaches. Negative for dizziness.  Hematological:  Does not bruise/bleed easily.   Physical Exam Updated Vital Signs BP (!) 160/81 (BP Location: Left Arm)   Pulse 92   Temp 98.5 F (36.9 C) (Oral)   Ht 5\' 8"  (1.727 m)   Wt 108.9 kg   SpO2 95%   BMI 36.49 kg/m   Physical  Exam Vitals and nursing note reviewed.  Constitutional:      General: She is not in acute distress.    Appearance: She is not ill-appearing.  HENT:     Head: Normocephalic and atraumatic.     Right Ear: Tympanic membrane, ear canal and external ear normal.     Left Ear: Tympanic membrane, ear canal and external ear normal.     Nose: No congestion.     Mouth/Throat:     Mouth: Mucous membranes are moist.     Pharynx: Oropharynx is clear. No oropharyngeal exudate or posterior oropharyngeal erythema.  Eyes:     Conjunctiva/sclera: Conjunctivae normal.  Cardiovascular:     Rate and Rhythm: Normal rate and regular rhythm.     Pulses: Normal pulses.     Heart sounds: No murmur heard.   No friction rub. No gallop.  Pulmonary:      Effort: No respiratory distress.     Breath sounds: No wheezing, rhonchi or rales.  Abdominal:     Palpations: Abdomen is soft.     Tenderness: There is no abdominal tenderness.  Musculoskeletal:     Right lower leg: No edema.     Left lower leg: No edema.     Comments: Patient is moving all 4 extremities out difficulty.  Skin:    General: Skin is warm and dry.  Neurological:     Mental Status: She is alert.     Comments: Face is symmetrical, patient is having no difficulty with word finding.  Psychiatric:        Mood and Affect: Mood normal.    ED Results / Procedures / Treatments   Labs (all labs ordered are listed, but only abnormal results are displayed) Labs Reviewed  RESP PANEL BY RT-PCR (FLU A&B, COVID) ARPGX2 - Abnormal; Notable for the following components:      Result Value   SARS Coronavirus 2 by RT PCR POSITIVE (*)    All other components within normal limits  GROUP A STREP BY PCR    EKG None  Radiology DG Chest Port 1 View  Result Date: 05/08/2021 CLINICAL DATA:  Cough EXAM: PORTABLE CHEST 1 VIEW COMPARISON:  None. FINDINGS: Heart size upper normal. Negative for heart failure. Lungs well aerated without infiltrate or effusion Left rib fractures of indeterminate age. IMPRESSION: No active disease. Electronically Signed   By: Marlan Palau M.D.   On: 05/08/2021 14:14    Procedures Procedures   Medications Ordered in ED Medications  guaiFENesin-dextromethorphan (ROBITUSSIN DM) 100-10 MG/5ML syrup 5 mL (has no administration in time range)  predniSONE 5 MG/5ML solution 30 mg (has no administration in time range)  prochlorperazine (COMPAZINE) tablet 10 mg (10 mg Oral Given 05/08/21 1405)    ED Course  I have reviewed the triage vital signs and the nursing notes.  Pertinent labs & imaging results that were available during my care of the patient were reviewed by me and considered in my medical decision making (see chart for details).    MDM  Rules/Calculators/A&P                         Initial impression-patient presents with URI-like symptoms.  She is alert, does not appear in distress, vital signs reassuring.  Suspect viral infection, will obtain strep test, respiratory panel, chest x-ray provide patient with migraine cocktail and reassess.  Work-up-strep test negative, chest x-ray unremarkable.  COVID positive  Rule out-Low suspicion for systemic infection as  patient is nontoxic-appearing, vital signs reassuring, no obvious source infection noted on exam.  Low suspicion for pneumonia as lung sounds are clear bilaterally, x-ray did not reveal any acute findings.  I have low suspicion for PE as patient denies pleuritic chest pain, shortness of breath, patient is PERC. low suspicion for strep throat as oropharynx was visualized, no erythema or exudates noted, strep test negative.  Low suspicion patient would need  hospitalized due to viral infection or Covid as vital signs reassuring, patient is not in respiratory distress.  Low suspicion for intracranial head bleed and/or CVA as there is no focal deficits present my exam, patient denies recent falls, denies paresthesias, weakness, in the upper or lower extremities, no visual changes.  I suspect headache is secondary due to COVID infection.   Plan-  URI-like symptoms-suspect secondary due to COVID, unfortunate patient is outside the antiviral window.  Will recommend over-the-counter pain medications, hydrate, follow-up with post-COVID care for further evaluation.  Vital signs have remained stable, no indication for hospital admission.  Patient given at home care as well strict return precautions.  Patient verbalized that they understood agreed to said plan.  Final Clinical Impression(s) / ED Diagnoses Final diagnoses:  COVID    Rx / DC Orders ED Discharge Orders     None        Carroll Sage, PA-C 05/08/21 1530    Eber Hong, MD 05/09/21 [id number]
